# Patient Record
Sex: Male | Born: 1970 | Race: White | Hispanic: No | Marital: Single
Health system: Southern US, Community
[De-identification: ages and names within clinical notes are randomized; demographics above are authoritative.]

## PROBLEM LIST (undated history)

## (undated) ENCOUNTER — Emergency Department (HOSPITAL_COMMUNITY): Payer: PRIVATE HEALTH INSURANCE

## (undated) HISTORY — PX: WISDOM TOOTH EXTRACTION: SHX21

---

## 2006-07-06 ENCOUNTER — Emergency Department: Payer: Self-pay | Admitting: Emergency Medicine

## 2008-06-29 ENCOUNTER — Emergency Department: Payer: Self-pay | Admitting: Emergency Medicine

## 2010-09-19 IMAGING — CR DG CHEST 2V
1 series · 2 of 2 positions shown · non-contrast
Comparison: none

REASON FOR EXAM: fever
COMMENTS:

[Series 1: view not recorded · 0.17mm/px · 2 of 2 slices shown]
[im 1/2]
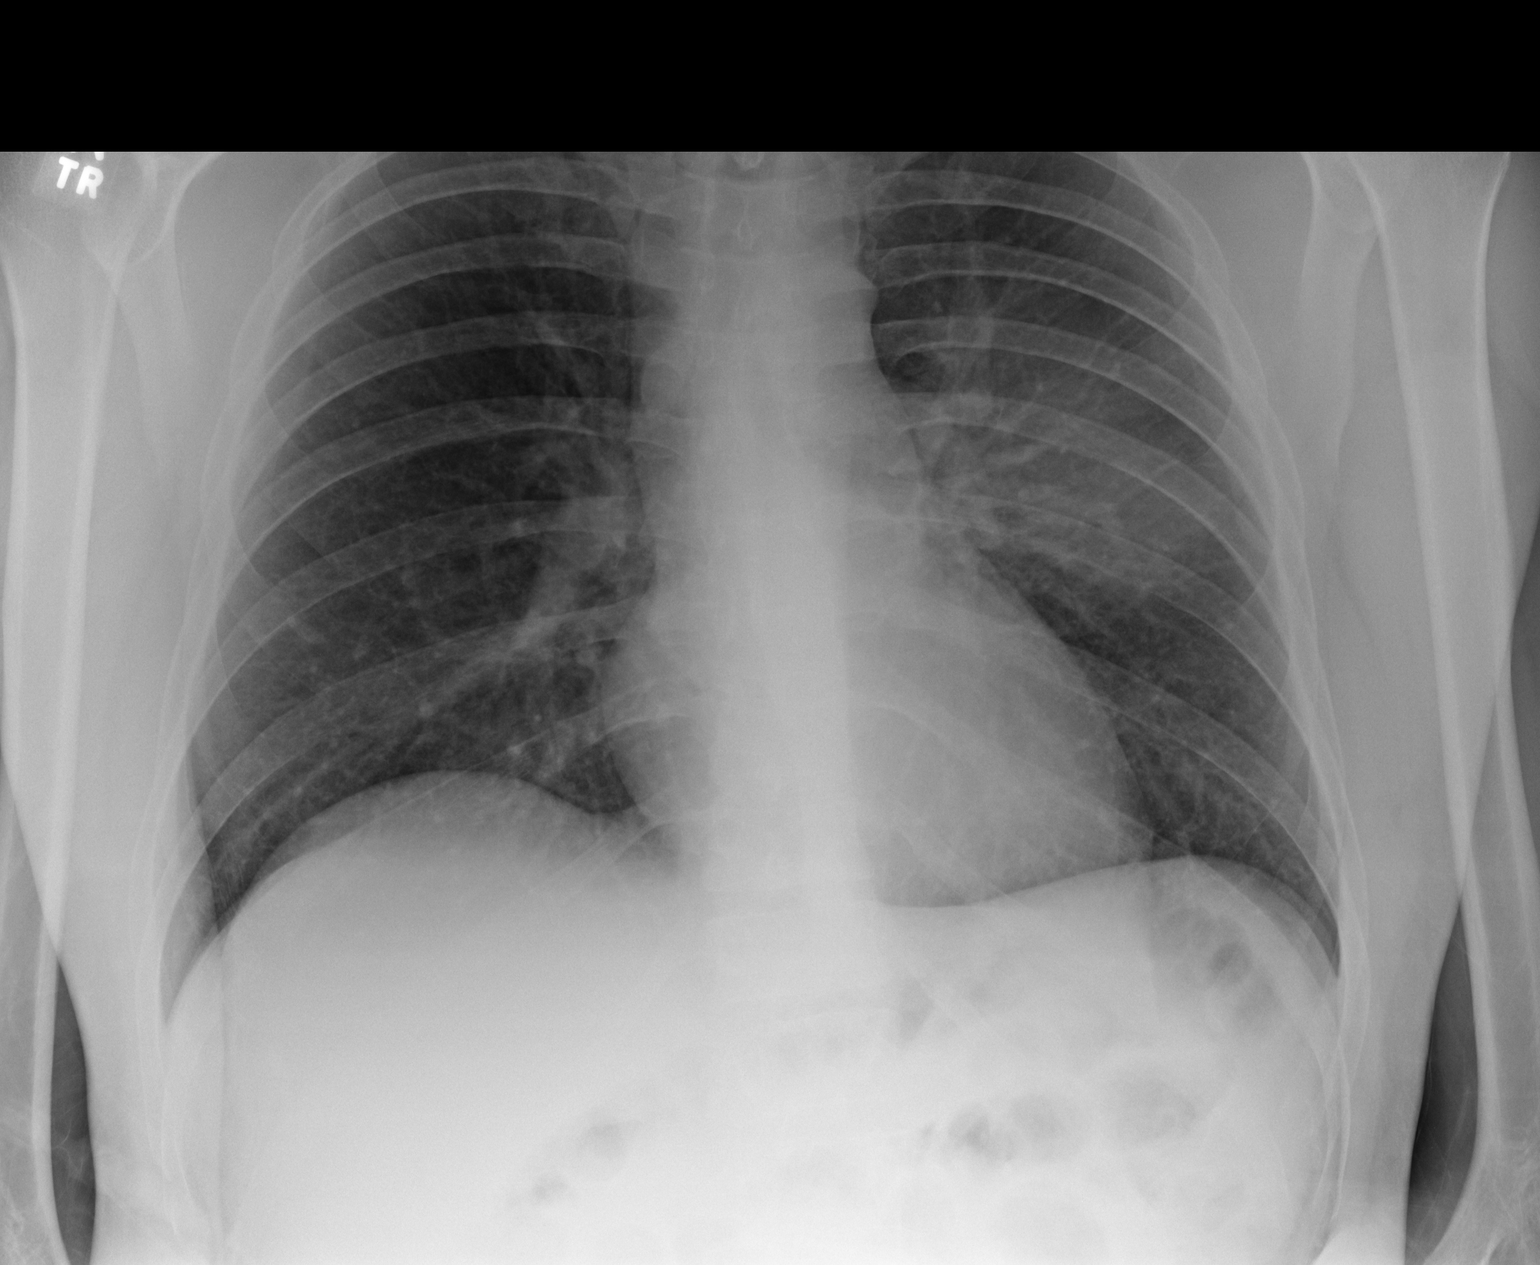
[im 2/2]
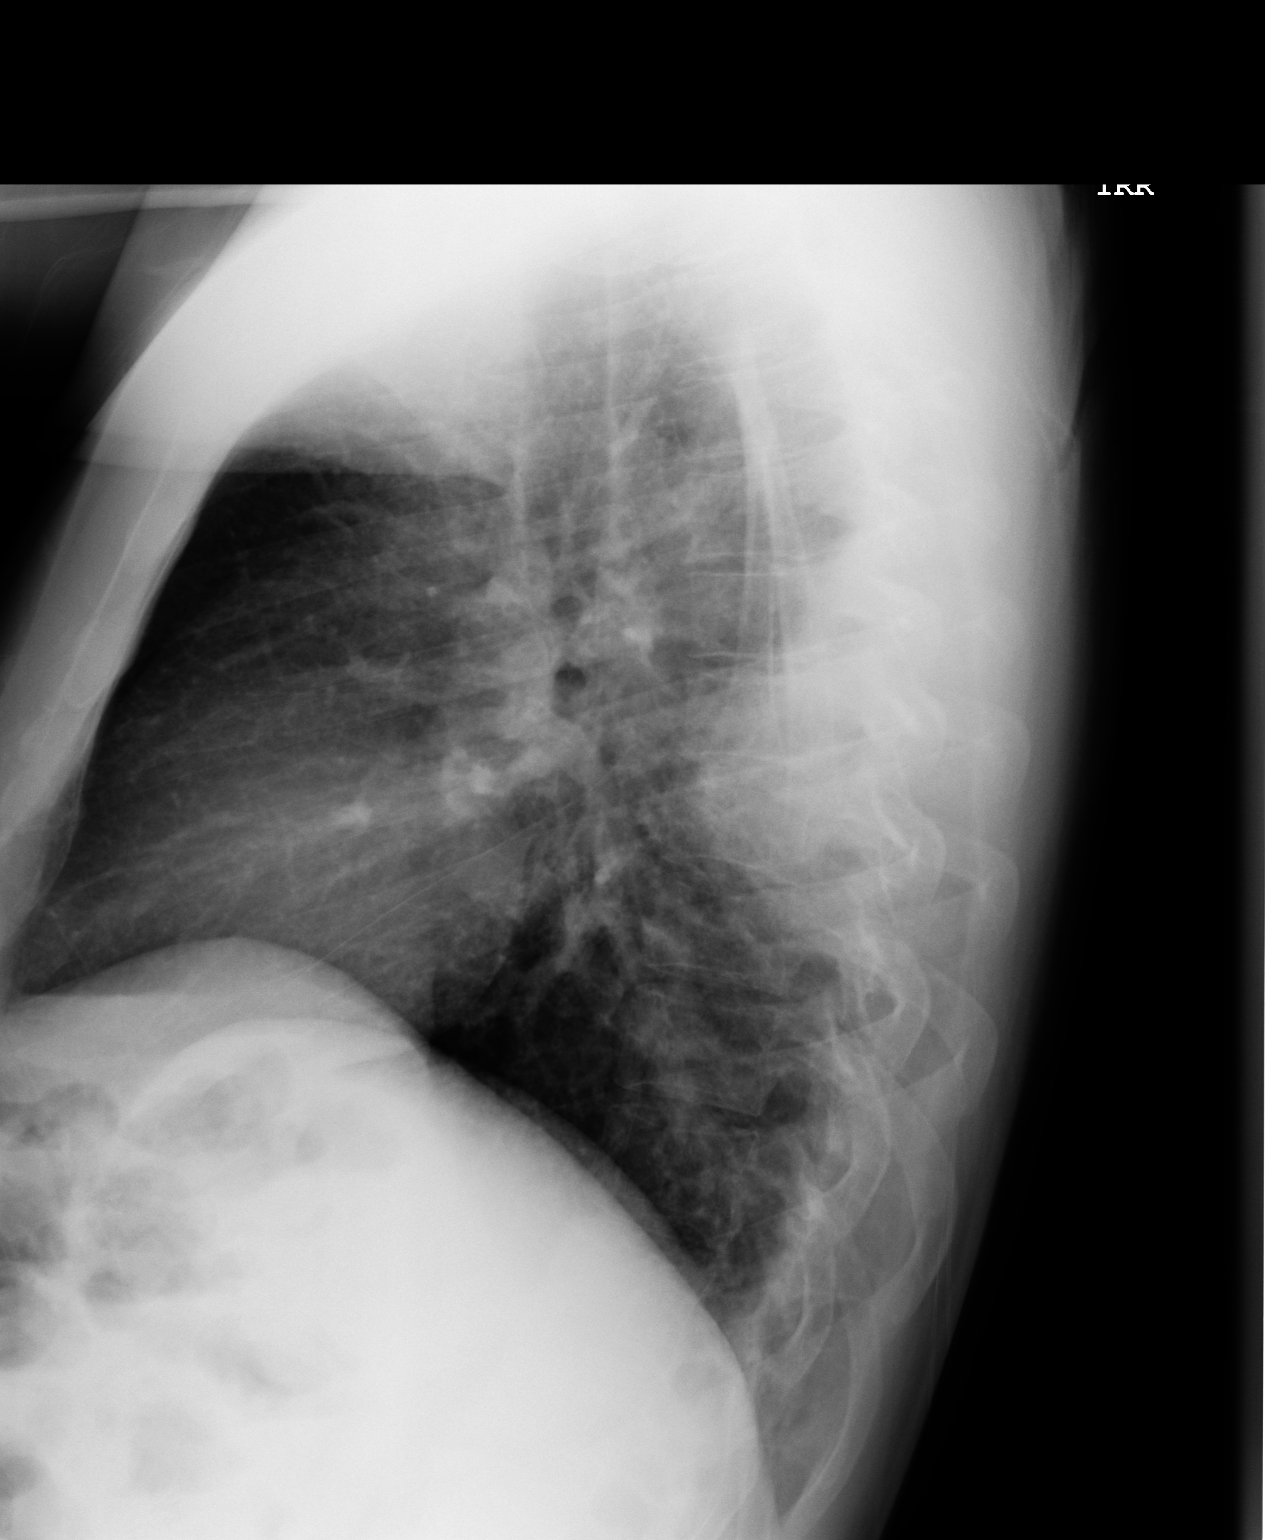

[2 of 2 positions shown; findings below may reference images not displayed]

PROCEDURE:     DXR - DXR CHEST PA (OR AP) AND LATERAL  - June 29, 2008  [DATE]

RESULT:     Two views of the chest are performed. There is no previous exam
for comparison.

There is increased density in the left mid lung posteriorly abutting the
pleura which is concerning for a superior segment left lower lobe pneumonia.
Underlying malignancy is not excluded but given the patient's age is felt to
be less likely. Follow-up to document clearing is recommended. Laboratory
correlation is suggested.
IMPRESSION: Probable superior segment left lower lobe pneumonia. No
other focal abnormality. Follow-up as recommended above.

## 2010-09-19 IMAGING — CT CT HEAD WITHOUT CONTRAST
1 series · 16 of 30 positions shown, 20 images · non-contrast
Comparison: none

REASON FOR EXAM: headache
COMMENTS:   LMP: (Male)

PROCEDURE:     CT  - CT HEAD WITHOUT CONTRAST  - June 29, 2008  [DATE]
RESULT:     History: Headache comparison
Comparison studies: No prior

[Series 2: soft tissue · axial · 0.44mm/px · z∈[+452,+588]mm · 16 of 31 slices shown, 20 images]
[im 2/31  brain]
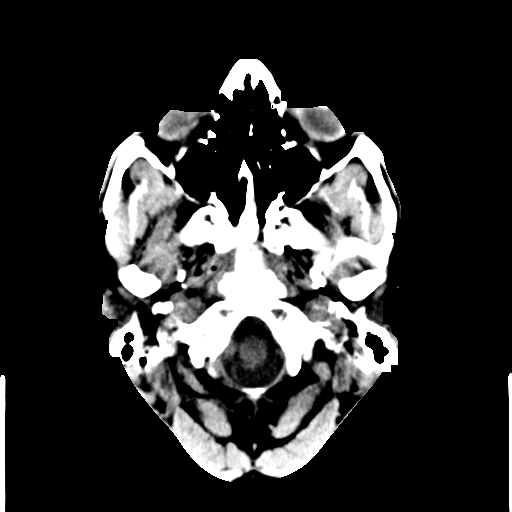
[im 2/31  bone]
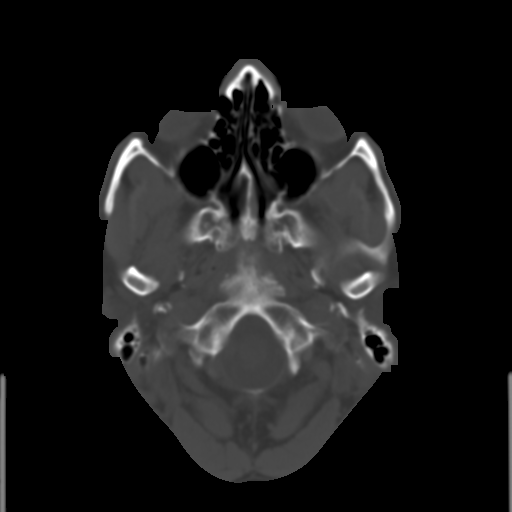
[im 4/31  brain]
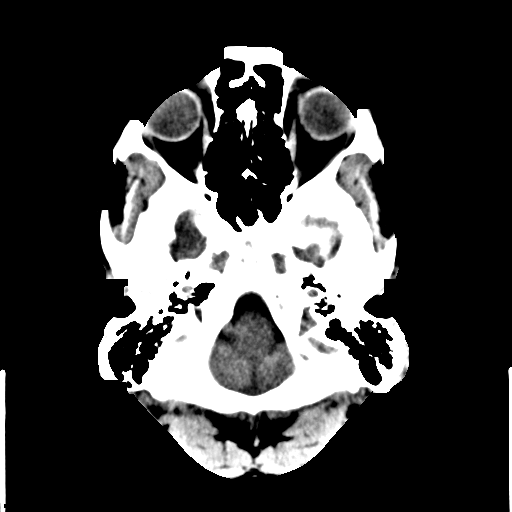
[im 6/31  brain]
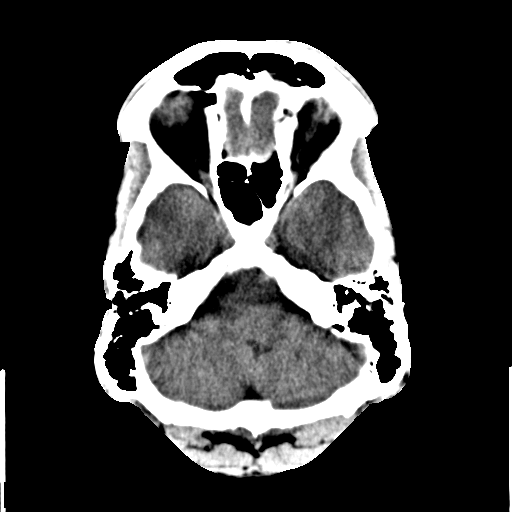
[im 8/31  brain]
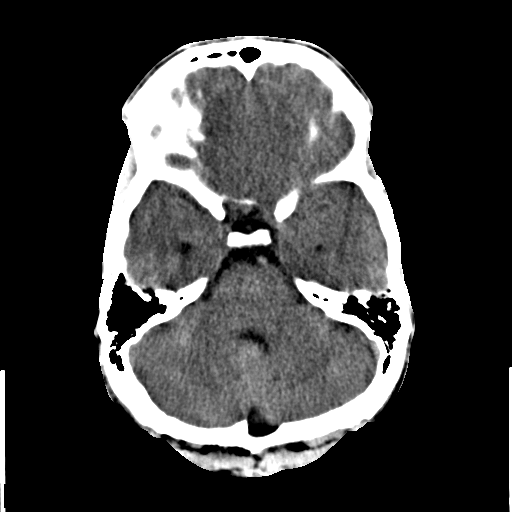
[im 9/31  brain]
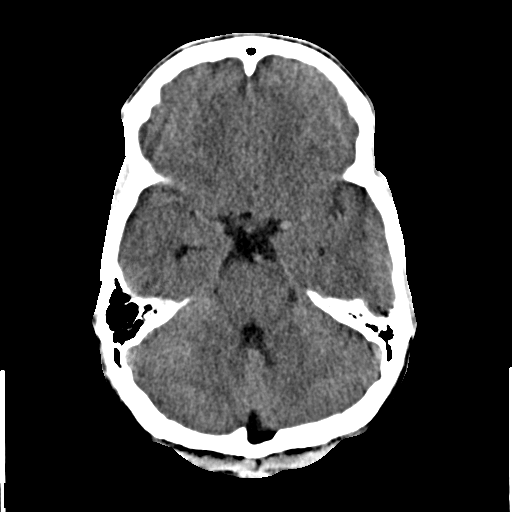
[im 9/31  bone]
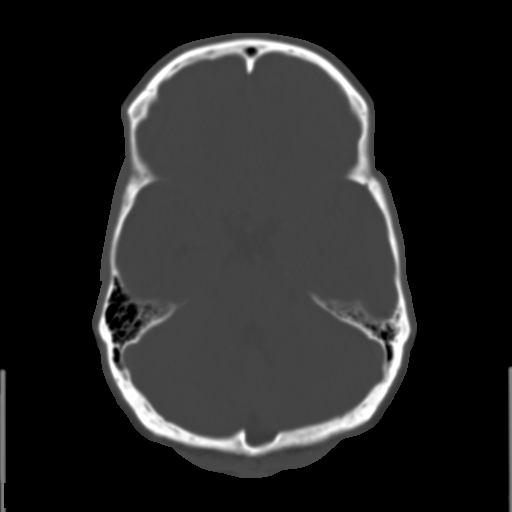
[im 11/31  brain]
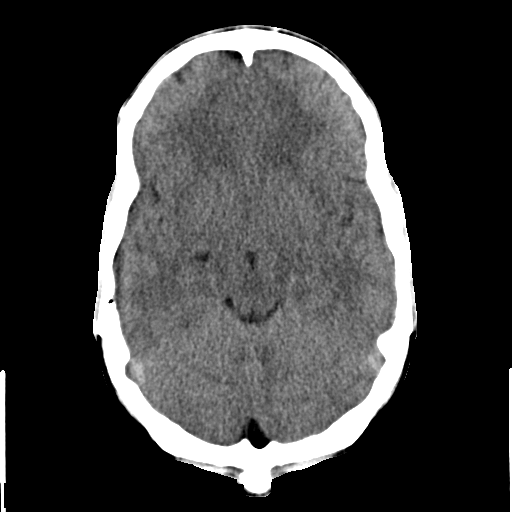
[im 13/31  brain]
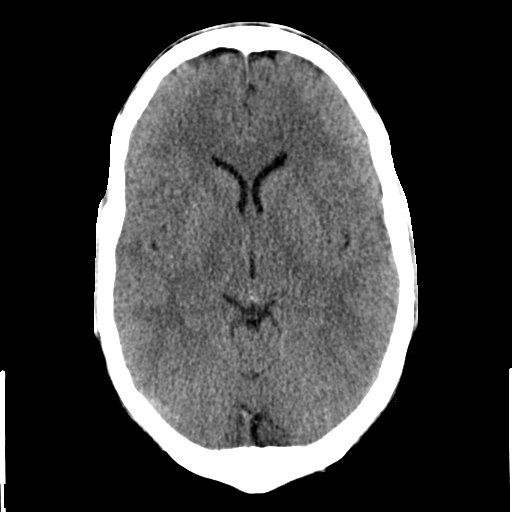
[im 15/31  brain]
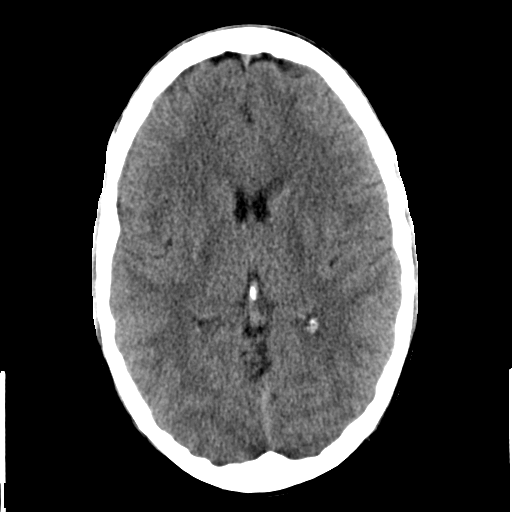
[im 16/31  brain]
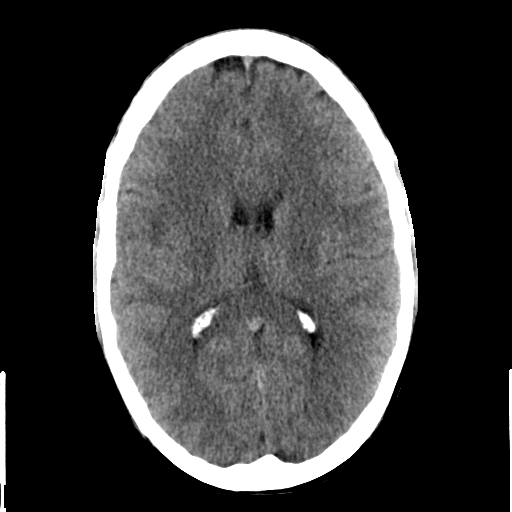
[im 16/31  bone]
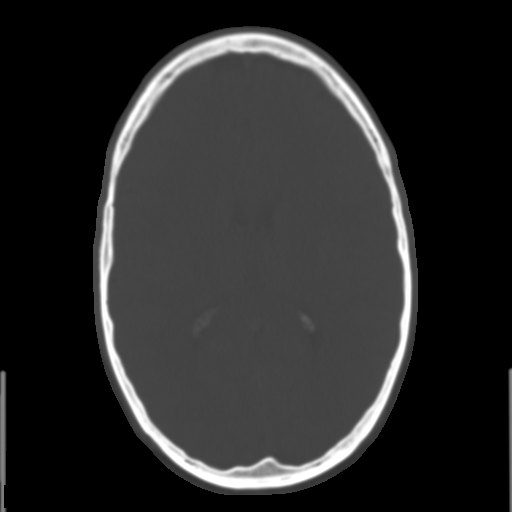
[im 18/31  brain]
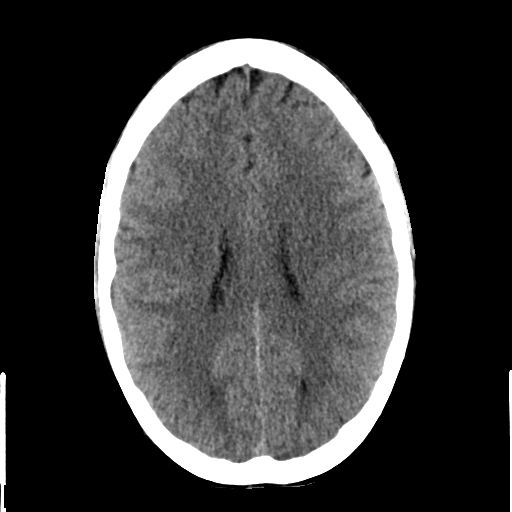
[im 20/31  brain]
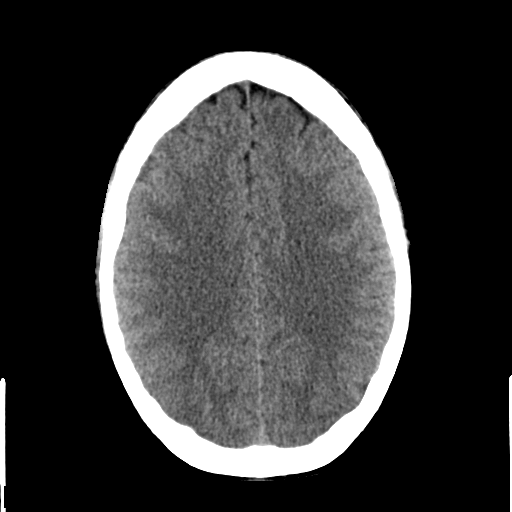
[im 22/31  brain]
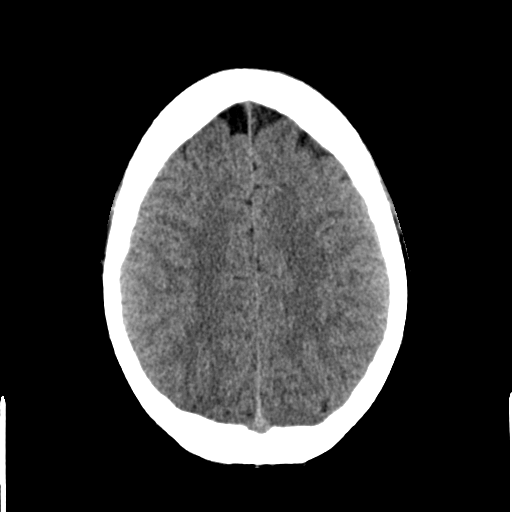
[im 23/31  brain]
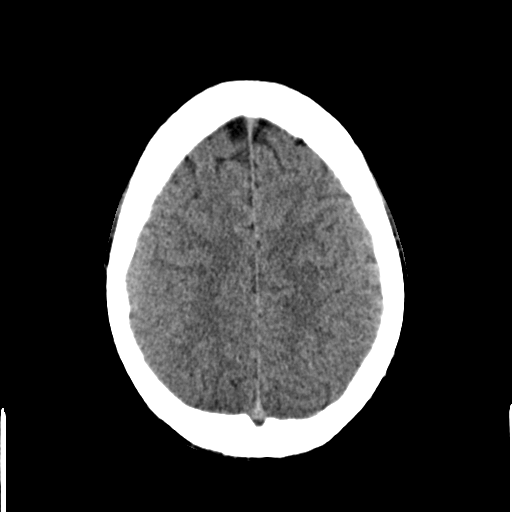
[im 23/31  bone]
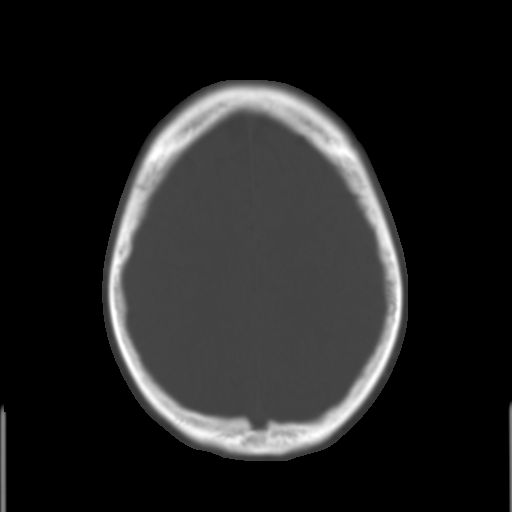
[im 25/31  brain]
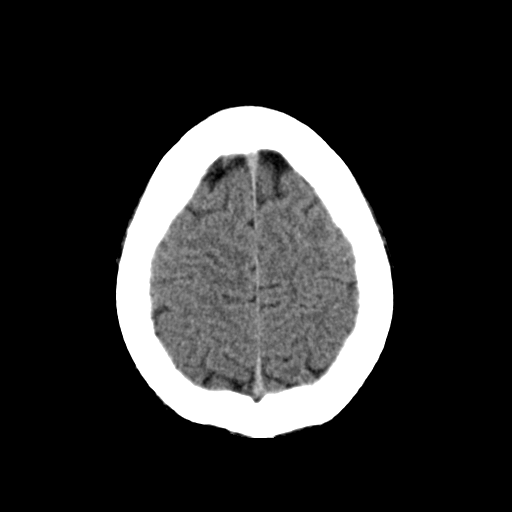
[im 27/31  brain]
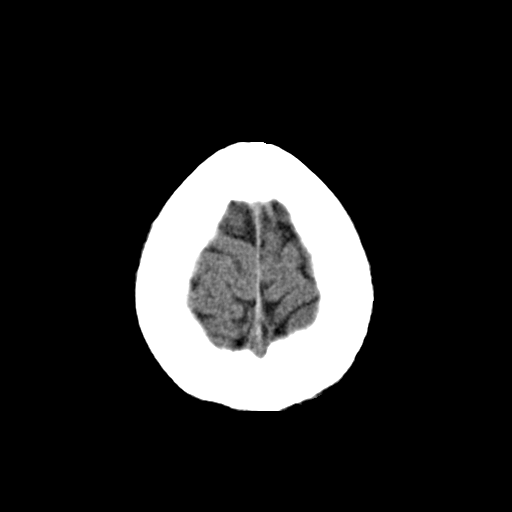
[im 29/31  brain]
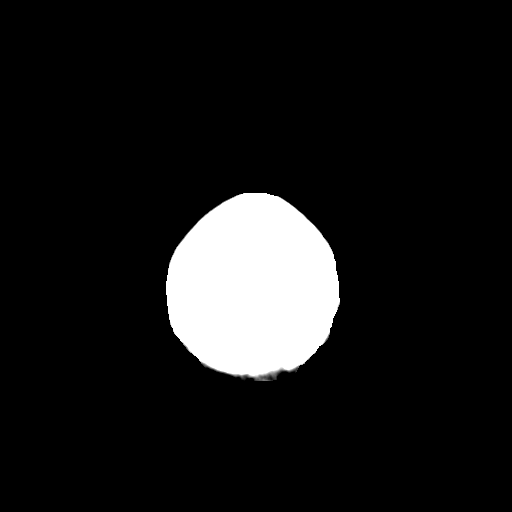

[16 of 30 positions shown; findings below may reference images not displayed]

FINDINGS: No intra-axial or extra-axial pathologic fluid or blood
collections identified. No mass lesions noted. No acute bony abnormalities
noted.
IMPRESSION: No acute abnormality.

## 2020-07-01 ENCOUNTER — Other Ambulatory Visit: Payer: Self-pay

## 2020-07-01 ENCOUNTER — Emergency Department
Admission: EM | Admit: 2020-07-01 | Discharge: 2020-07-01 | Disposition: A | Discharge status: Home / Self Care | Payer: Commercial Managed Care - PPO | Attending: Emergency Medicine | Admitting: Emergency Medicine

## 2020-07-01 DIAGNOSIS — H81399 Other peripheral vertigo, unspecified ear: Secondary | ICD-10-CM | POA: Diagnosis not present

## 2020-07-01 DIAGNOSIS — R42 Dizziness and giddiness: Secondary | ICD-10-CM

## 2020-07-01 LAB — BASIC METABOLIC PANEL
Anion gap: 7 (ref 5–15)
BUN: 15 mg/dL (ref 6–20)
CO2: 26 mmol/L (ref 22–32)
Calcium: 8.9 mg/dL (ref 8.9–10.3)
Chloride: 105 mmol/L (ref 98–111)
Creatinine, Ser: 0.93 mg/dL (ref 0.61–1.24)
GFR, Estimated: >60 mL/min (ref >60)
Glucose, Bld: 99 mg/dL (ref 70–99)
Potassium: 4.7 mmol/L (ref 3.5–5.1)
Sodium: 138 mmol/L (ref 135–145)

## 2020-07-01 LAB — CBC WITH DIFFERENTIAL/PLATELET
Abs Immature Granulocytes: 0.12 10*3/uL — ABNORMAL HIGH (ref 0.00–0.07)
Basophils Absolute: 0 10*3/uL (ref 0.0–0.1)
Basophils Relative: 1 %
Eosinophils Absolute: 0.1 10*3/uL (ref 0.0–0.5)
Eosinophils Relative: 1 %
HCT: 46.5 % (ref 39.0–52.0)
Hemoglobin: 15.8 g/dL (ref 13.0–17.0)
Immature Granulocytes: 2 %
Lymphocytes Relative: 23 %
Lymphs Abs: 1.6 10*3/uL (ref 0.7–4.0)
MCH: 31.2 pg (ref 26.0–34.0)
MCHC: 34 g/dL (ref 30.0–36.0)
MCV: 91.7 fL (ref 80.0–100.0)
Monocytes Absolute: 0.5 10*3/uL (ref 0.1–1.0)
Monocytes Relative: 8 %
Neutro Abs: 4.8 10*3/uL (ref 1.7–7.7)
Neutrophils Relative %: 65 %
Platelets: 274 10*3/uL (ref 150–400)
RBC: 5.07 MIL/uL (ref 4.22–5.81)
RDW: 13 % (ref 11.5–15.5)
WBC: 7.2 10*3/uL (ref 4.0–10.5)
nRBC: 0 % (ref 0.0–0.2)

## 2020-07-01 MED ORDER — MECLIZINE HCL 25 MG PO TABS: 25.0000 mg | ORAL_TABLET | Freq: Three times a day (TID) | ORAL | 0 refills | Status: AC | PRN

## 2020-07-01 MED ORDER — MECLIZINE HCL 25 MG PO TABS
25.0000 mg | ORAL_TABLET | Freq: Once | ORAL | Status: AC
  Administered 2020-07-01: 25 mg via ORAL
  Filled 2020-07-01: qty 1

## 2020-07-01 NOTE — ED Provider Notes (Signed)
[facility] Emergency Department Provider Note   ____________________________________________   Event Date/Time   First MD Initiated Contact with Patient 07/01/20 [phone]     (approximate)  I have reviewed the triage vital signs and the nursing notes.   HISTORY  Chief Complaint Dizziness    HPI Ruben Ramos is a 50 y.o. male with no significant past medical history who presents to the ED complaining of dizziness.  Patient states that as he was getting into bed last night he started to feel like the room was spinning around him.  Symptoms lasted for 2 to 3 minutes but improved as he laid still in bed.  He denies any associated vision changes, speech changes, numbness, or weakness.  He has not had any nausea or vomiting.  He states that when he went to get up this morning he again felt like the room was spinning around him.  Symptoms again improved when he is laying still and are worse with any movement of his head.  He has not had any lightheadedness, chest pain, or shortness of breath.  He denies any history of similar symptoms.        History reviewed. No pertinent past medical history.  There are no problems to display for this patient.   Past Surgical History:  Procedure Laterality Date  . WISDOM TOOTH EXTRACTION      Prior to Admission medications   Medication Sig Start Date End Date Taking? Authorizing Provider  meclizine (ANTIVERT) 25 MG tablet Take 1 tablet (25 mg total) by mouth 3 (three) times daily as needed for dizziness. 07/01/20  Yes Chesley Noon, MD    Allergies Patient has no known allergies.  No family history on file.  Social History    Review of Systems  Constitutional: No fever/chills Eyes: No visual changes. ENT: No sore throat. Cardiovascular: Denies chest pain. Respiratory: Denies shortness of breath. Gastrointestinal: No abdominal pain.  No nausea, no vomiting.  No diarrhea.  No constipation. Genitourinary: Negative  for dysuria. Musculoskeletal: Negative for back pain. Skin: Negative for rash. Neurological: Negative for headaches, focal weakness or numbness.  Positive for dizziness.  ____________________________________________   PHYSICAL EXAM:  VITAL SIGNS: ED Triage Vitals  Enc Vitals Group     BP 07/01/20 0714 (!) 144/93     Pulse Rate 07/01/20 0714 78     Resp 07/01/20 0714 18     Temp 07/01/20 0714 97.9 F (36.6 C)     Temp Source 07/01/20 0714 Oral     SpO2 07/01/20 0714 97 %     Weight 07/01/20 0715 190 lb (86.2 kg)     Height 07/01/20 0715 5\' 7"  (1.702 m)     Head Circumference --      Peak Flow --      Pain Score 07/01/20 0715 0     Pain Loc --      Pain Edu? --      Excl. in GC? --     Constitutional: Alert and oriented. Eyes: Conjunctivae are normal.  Pupils equal round and reactive to light bilaterally. Head: Atraumatic. Nose: No congestion/rhinnorhea. Mouth/Throat: Mucous membranes are moist. Neck: Normal ROM Cardiovascular: Normal rate, regular rhythm. Grossly normal heart sounds. Respiratory: Normal respiratory effort.  No retractions. Lungs CTAB. Gastrointestinal: Soft and nontender. No distention. Genitourinary: deferred Musculoskeletal: No lower extremity tenderness nor edema. Neurologic:  Normal speech and language. No gross focal neurologic deficits are appreciated. Skin:  Skin is warm, dry and intact. No rash  noted. Psychiatric: Mood and affect are normal. Speech and behavior are normal.  ____________________________________________   LABS (all labs ordered are listed, but only abnormal results are displayed)  Labs Reviewed  CBC WITH DIFFERENTIAL/PLATELET - Abnormal; Notable for the following components:      Result Value   Abs Immature Granulocytes 0.12 (*)    All other components within normal limits  BASIC METABOLIC PANEL   ____________________________________________  EKG  ED ECG REPORT I, Chesley Noon, the attending physician, personally  viewed and interpreted this ECG.   Date: 07/01/2020  EKG Time: 7:19  Rate: 77  Rhythm: normal sinus rhythm  Axis: Normal  Intervals:none  ST&T Change: None   PROCEDURES  Procedure(s) performed (including Critical Care):  Procedures   ____________________________________________   INITIAL IMPRESSION / ASSESSMENT AND PLAN / ED COURSE       50 year old male with no significant past medical history presents to the ED complaining of intermittent dizziness and feeling like the room is spinning around him since last night.  Symptoms are associated with positional changes and he has no focal neurologic deficits on exam.  Symptoms sound most consistent with a peripheral vertigo and I have low suspicion for stroke.  EKG shows no evidence of arrhythmia or ischemia and no reason to suspect cardiac etiology.  We will screen labs and treat with meclizine.  Labs are reassuring, no anemia or electrolyte abnormality noted. Patient reports dizziness is improved following meclizine, he is appropriate for discharge home with PCP follow-up. He was counseled to return to the ED for any new or worsening symptoms, patient agrees with plan.      ____________________________________________   FINAL CLINICAL IMPRESSION(S) / ED DIAGNOSES  Final diagnoses:  Dizziness  Peripheral vertigo, unspecified laterality     ED Discharge Orders         Ordered    meclizine (ANTIVERT) 25 MG tablet  3 times daily PRN        07/01/20 0940           Note:  This document was prepared using Dragon voice recognition software and may include unintentional dictation errors.   Chesley Noon, MD 07/01/20 [phone]

## 2020-07-01 NOTE — ED Triage Notes (Signed)
Pt states he started having dizziness last night before bed- pt states it feels like the room is spinning around him, worse when he moves his head- pt denies ETOH
